# Patient Record
Sex: Male | Born: 1995 | Race: Black or African American | Hispanic: No | Marital: Single | Smoking: Never smoker
Health system: Southern US, Community
[De-identification: ages and names within clinical notes are randomized; demographics above are authoritative.]

## PROBLEM LIST (undated history)

## (undated) DIAGNOSIS — K219 Gastro-esophageal reflux disease without esophagitis: Secondary | ICD-10-CM

---

## 2019-11-11 ENCOUNTER — Ambulatory Visit
Admission: EM | Admit: 2019-11-11 | Discharge: 2019-11-11 | Disposition: A | Discharge status: Home / Self Care | Payer: Self-pay | Attending: Physician Assistant | Admitting: Physician Assistant

## 2019-11-11 DIAGNOSIS — R0789 Other chest pain: Secondary | ICD-10-CM

## 2019-11-11 DIAGNOSIS — Z20822 Contact with and (suspected) exposure to covid-19: Secondary | ICD-10-CM

## 2019-11-11 DIAGNOSIS — Z20828 Contact with and (suspected) exposure to other viral communicable diseases: Secondary | ICD-10-CM | POA: Diagnosis not present

## 2019-11-11 HISTORY — DX: Gastro-esophageal reflux disease without esophagitis: K21.9

## 2019-11-11 MED ORDER — METHOCARBAMOL 500 MG PO TABS: 500.0000 mg | ORAL_TABLET | Freq: Two times a day (BID) | ORAL | 0 refills | Status: AC

## 2019-11-11 NOTE — ED Provider Notes (Signed)
[facility]    CSN: [id number] Arrival date & time: 11/11/19  1005      History   Chief Complaint Chief Complaint  Patient presents with  . chest tightness    HPI Patrick Mitchell is a 23 y.o. male.   23 year old male comes in for 1 week history of left sided chest tightness. Denies URI symptoms. Denies fever, chills, body aches. Denies abdominal pain, nausea, vomiting, diarrhea. Denies shortness of breath, loss of taste/smell. Left sided chest tightness is constant, worse with laying down. Denies association with food. History of acid reflux that feels that same. Started nexium without relief.  Patient would like COVID testing as he has had secondary exposure to positive COVID.      Past Medical History:  Diagnosis Date  . Acid reflux     There are no active problems to display for this patient.   History reviewed. No pertinent surgical history.     Home Medications    Prior to Admission medications   Medication Sig Start Date End Date Taking? Authorizing Provider  methocarbamol (ROBAXIN) 500 MG tablet Take 1 tablet (500 mg total) by mouth 2 (two) times daily. 11/11/19   Ok Edwards, PA-C    Family History No family history on file.  Social History Social History   Tobacco Use  . Smoking status: Never Smoker  . Smokeless tobacco: Never Used  Substance Use Topics  . Alcohol use: Yes    Comment: social   . Drug use: Never     Allergies   Patient has no known allergies.   Review of Systems Review of Systems  Reason unable to perform ROS: See HPI as above.     Physical Exam Triage Vital Signs ED Triage Vitals  Enc Vitals Group     BP 11/11/19 1025 129/82     Pulse Rate 11/11/19 1025 80     Resp 11/11/19 1025 16     Temp 11/11/19 1025 98.2 F (36.8 C)     Temp Source 11/11/19 1025 Oral     SpO2 11/11/19 1025 97 %     Weight --      Height --      Head Circumference --      Peak Flow --      Pain Score 11/11/19 1026 2     Pain  Loc --      Pain Edu? --      Excl. in [place]? --    No data found.  Updated Vital Signs BP 129/82 (BP Location: Left Arm)   Pulse 80   Temp 98.2 F (36.8 C) (Oral)   Resp 16   SpO2 97%   Physical Exam Constitutional:      General: He is not in acute distress.    Appearance: Normal appearance. He is not ill-appearing, toxic-appearing or diaphoretic.  HENT:     Head: Normocephalic and atraumatic.     Mouth/Throat:     Mouth: Mucous membranes are moist.     Pharynx: Oropharynx is clear. Uvula midline.  Neck:     Musculoskeletal: Normal range of motion and neck supple.  Cardiovascular:     Rate and Rhythm: Normal rate and regular rhythm.     Heart sounds: Normal heart sounds. No murmur. No friction rub. No gallop.   Pulmonary:     Effort: Pulmonary effort is normal. No accessory muscle usage, prolonged expiration, respiratory distress or retractions.     Comments: Lungs clear to  auscultation without adventitious lung sounds. Chest:     Comments: No tenderness to palpation.  Abdominal:     General: Bowel sounds are normal.     Palpations: Abdomen is soft.     Tenderness: There is no abdominal tenderness. There is no right CVA tenderness, left CVA tenderness, guarding or rebound.  Musculoskeletal:     Comments: No tenderness to palpation of shoulder, neck, elbow. Full ROM. Strength normal and equal bilaterally. Sensation intact and equal bilaterally.   Neurological:     General: No focal deficit present.     Mental Status: He is alert and oriented to person, place, and time.    UC Treatments / Results  Labs (all labs ordered are listed, but only abnormal results are displayed) Labs Reviewed  NOVEL CORONAVIRUS, NAA    EKG   Radiology No results found.  Procedures Procedures (including critical care time)  Medications Ordered in UC Medications - No data to display  Initial Impression / Assessment and Plan / UC Course  I have reviewed the triage vital signs and the  nursing notes.  Pertinent labs & imaging results that were available during my care of the patient were reviewed by me and considered in my medical decision making (see chart for details).    COVID ordered. Patient to quarantine until testing results return. No alarming signs on exam.  Patient speaking in full sentences without respiratory distress. Discussed possible acid reflux/muscle spasms causing symptoms as well. Symptomatic treatment discussed.  Push fluids.  Return precautions given.  Patient expresses understanding and agrees to plan.  Final Clinical Impressions(s) / UC Diagnoses   Final diagnoses:  Exposure to COVID-19 virus  Chest tightness   ED Prescriptions    Medication Sig Dispense Auth. Provider   methocarbamol (ROBAXIN) 500 MG tablet Take 1 tablet (500 mg total) by mouth 2 (two) times daily. 20 tablet Belinda Fisher, PA-C     PDMP not reviewed this encounter.   Belinda Fisher, PA-C 11/11/19 1202

## 2019-11-11 NOTE — Discharge Instructions (Signed)
COVID testing ordered, please remain in quarantine until testing results return. Ibuprofen 800mg  three times a day for muscle pain. Robaxin as needed, this can make you drowsy, so do not take if you are going to drive, operate heavy machinery, or make important decisions. If experiencing shortness of breath, trouble breathing, go to the emergency department for further evaluation needed.

## 2019-11-11 NOTE — ED Triage Notes (Signed)
Pt c/o chest tightness to lt upper chest area for 1 wk. States hx of same and dx'd with acid reflux. States taken Nexium with no relief. Pt requesting COVID testing

## 2019-11-13 LAB — NOVEL CORONAVIRUS, NAA: SARS-CoV-2, NAA: NOT DETECTED

## 2020-05-17 ENCOUNTER — Emergency Department (HOSPITAL_BASED_OUTPATIENT_CLINIC_OR_DEPARTMENT_OTHER)
Admission: EM | Admit: 2020-05-17 | Discharge: 2020-05-17 | Disposition: A | Discharge status: Home / Self Care | Payer: BC Managed Care – PPO | Attending: Emergency Medicine | Admitting: Emergency Medicine

## 2020-05-17 ENCOUNTER — Encounter (HOSPITAL_BASED_OUTPATIENT_CLINIC_OR_DEPARTMENT_OTHER): Payer: Self-pay

## 2020-05-17 ENCOUNTER — Emergency Department (HOSPITAL_BASED_OUTPATIENT_CLINIC_OR_DEPARTMENT_OTHER): Payer: BC Managed Care – PPO

## 2020-05-17 ENCOUNTER — Other Ambulatory Visit: Payer: Self-pay

## 2020-05-17 DIAGNOSIS — R079 Chest pain, unspecified: Secondary | ICD-10-CM

## 2020-05-17 DIAGNOSIS — R0789 Other chest pain: Secondary | ICD-10-CM | POA: Insufficient documentation

## 2020-05-17 LAB — TROPONIN I (HIGH SENSITIVITY): Troponin I (High Sensitivity): 2 ng/L (ref <18)

## 2020-05-17 NOTE — ED Provider Notes (Signed)
[facility] Provider Note   CSN: [id number] Arrival date & time: 05/17/20  4332     History Chief Complaint  Patient presents with  . Chest Pain    Patrick Mitchell is a 24 y.o. male.  HPI   24 year old male with chest pain.  Describes sensation of chest tightness.  He has had intermittent symptoms for months.  More noticeable in the past week.  Today he was getting ready for work.  He was running late and rushing.  As he was getting ready he felt this tightness again and he felt like his heart was racing.  He is unsure if it was related to anxiety or not.  As he was driving to work symptoms persisted so he went to his parents house who live nearby.  They thought it would be best if he came to the emergency room for evaluation.  Currently feeling better but still having this tightness.  Denies any other associated symptoms.  History of reflux but states that current symptoms feel different.  Is not currently on medication for the reflux.  Past Medical History:  Diagnosis Date  . Acid reflux     There are no problems to display for this patient.   History reviewed. No pertinent surgical history.     No family history on file.  Social History   Tobacco Use  . Smoking status: Never Smoker  . Smokeless tobacco: Never Used  Substance Use Topics  . Alcohol use: Yes    Comment: social   . Drug use: Never    Home Medications Prior to Admission medications   Medication Sig Start Date End Date Taking? Authorizing Provider  methocarbamol (ROBAXIN) 500 MG tablet Take 1 tablet (500 mg total) by mouth 2 (two) times daily. 11/11/19   Ok Edwards, PA-C    Allergies    Patient has no known allergies.  Review of Systems   Review of Systems All systems reviewed and negative, other than as noted in HPI.  Physical Exam Updated Vital Signs BP 121/84   Pulse 89   Temp 98.5 F (36.9 C) (Oral)   Resp 19   Ht 5\' 11"  (1.803 m)   Wt 85.1 kg   SpO2  100%   BMI 26.18 kg/m   Physical Exam Vitals and nursing note reviewed.  Constitutional:      General: He is not in acute distress.    Appearance: He is well-developed.  HENT:     Head: Normocephalic and atraumatic.  Eyes:     General:        Right eye: No discharge.        Left eye: No discharge.     Conjunctiva/sclera: Conjunctivae normal.  Cardiovascular:     Rate and Rhythm: Normal rate and regular rhythm.     Heart sounds: Normal heart sounds. No murmur heard.  No friction rub. No gallop.   Pulmonary:     Effort: Pulmonary effort is normal. No respiratory distress.     Breath sounds: Normal breath sounds.  Abdominal:     General: There is no distension.     Palpations: Abdomen is soft.     Tenderness: There is no abdominal tenderness.  Musculoskeletal:        General: No tenderness.     Cervical back: Neck supple.  Skin:    General: Skin is warm and dry.  Neurological:     Mental Status: He is alert.  Psychiatric:  Behavior: Behavior normal.        Thought Content: Thought content normal.     ED Results / Procedures / Treatments   Labs (all labs ordered are listed, but only abnormal results are displayed) Labs Reviewed  TROPONIN I (HIGH SENSITIVITY)    EKG EKG Interpretation  Date/Time:  Monday May 17 2020 09:19:51 EDT Ventricular Rate:  92 PR Interval:    QRS Duration: 84 QT Interval:  348 QTC Calculation: 431 R Axis:   77 Text Interpretation: Sinus rhythm Confirmed by Raeford Razor [phone]) on 05/17/2020 9:34:31 AM   Radiology DG Chest 2 View  Result Date: 05/17/2020 CLINICAL DATA:  Chest pain for 1 week.  Tachycardia. EXAM: CHEST - 2 VIEW COMPARISON:  04/27/2015 FINDINGS: The heart size and mediastinal contours are within normal limits. Both lungs are clear. No evidence of pneumothorax or pleural effusion. The visualized skeletal structures are unremarkable. IMPRESSION: Normal study. Electronically Signed   By: Danae Orleans M.D.   On:  05/17/2020 09:58    Procedures Procedures (including critical care time)  Medications Ordered in ED Medications - No data to display  ED Course  I have reviewed the triage vital signs and the nursing notes.  Pertinent labs & imaging results that were available during my care of the patient were reviewed by me and considered in my medical decision making (see chart for details).    MDM Rules/Calculators/A&P                          24 year old male with chest pain.  Atypical for ACS.  Doubt PE, dissection or other emergent process.  EKG with no overt ischemic changes.  Troponin normal.  Chest x-ray without any acute abnormality.  He is afebrile.  Lungs sound clear.  May benefit from being on a PPI or H2 blocker.  Not completely convinced that symptoms are from reflux though.  Regardless, I doubt emergent process.  Outpatient follow-up.  Final Clinical Impression(s) / ED Diagnoses Final diagnoses:  Chest pain, unspecified type    Rx / DC Orders ED Discharge Orders    None       Raeford Razor, MD 05/17/20 1043

## 2020-05-17 NOTE — ED Triage Notes (Signed)
t states that he has been having chest tightness for 1 week reports some shaking in his arms and hands and feeling like his heart was beating fast. Pt also reports headaches, and history of acid reflux.

## 2020-05-17 NOTE — ED Notes (Signed)
Patient transported to X-ray 

## 2021-05-29 IMAGING — CR DG CHEST 2V
2 series · 2 of 2 positions shown · non-contrast
Comparison: 04/27/2015

CLINICAL DATA: Chest pain for 1 week.  Tachycardia.

EXAM:
CHEST - 2 VIEW

[w chest pa]
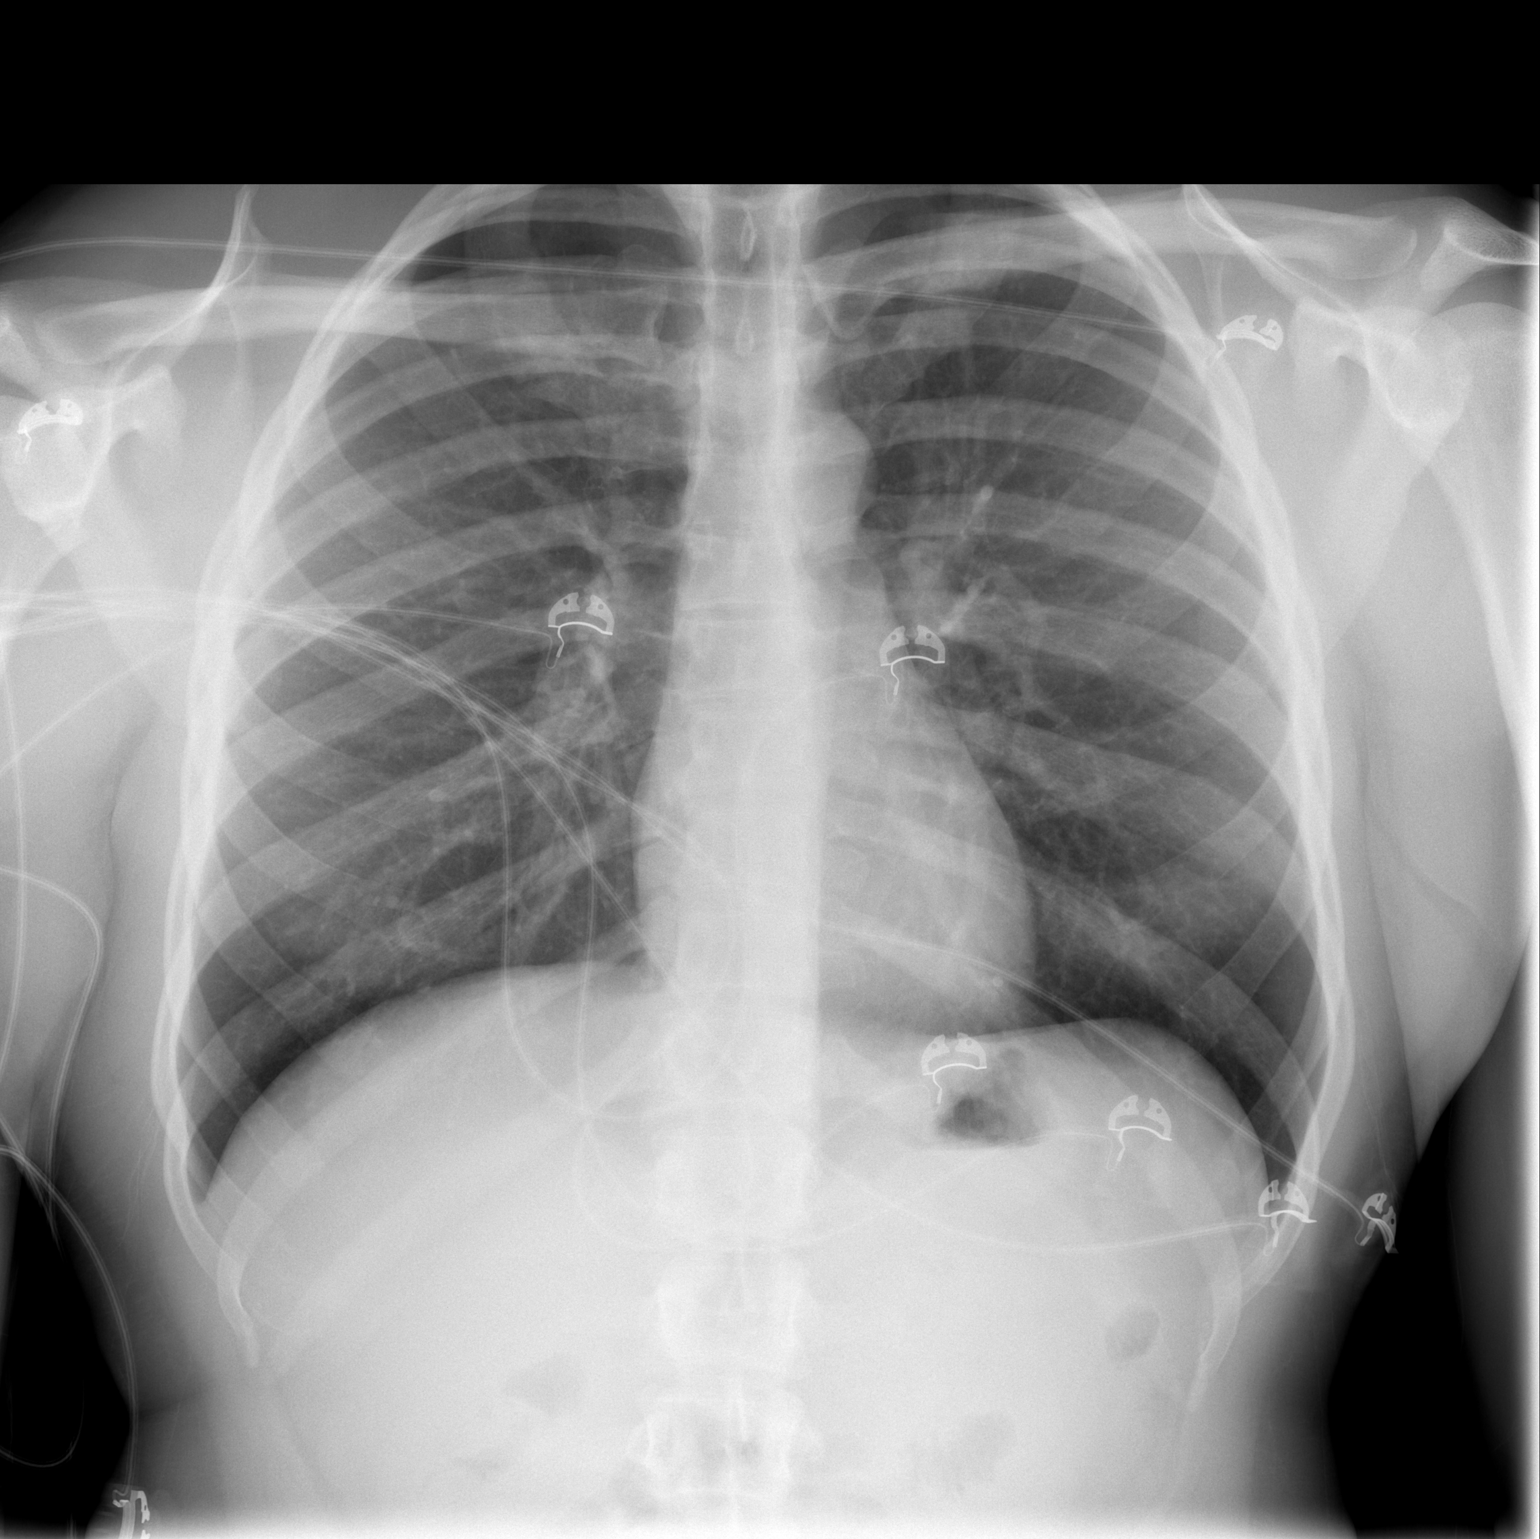

[w chest lat]
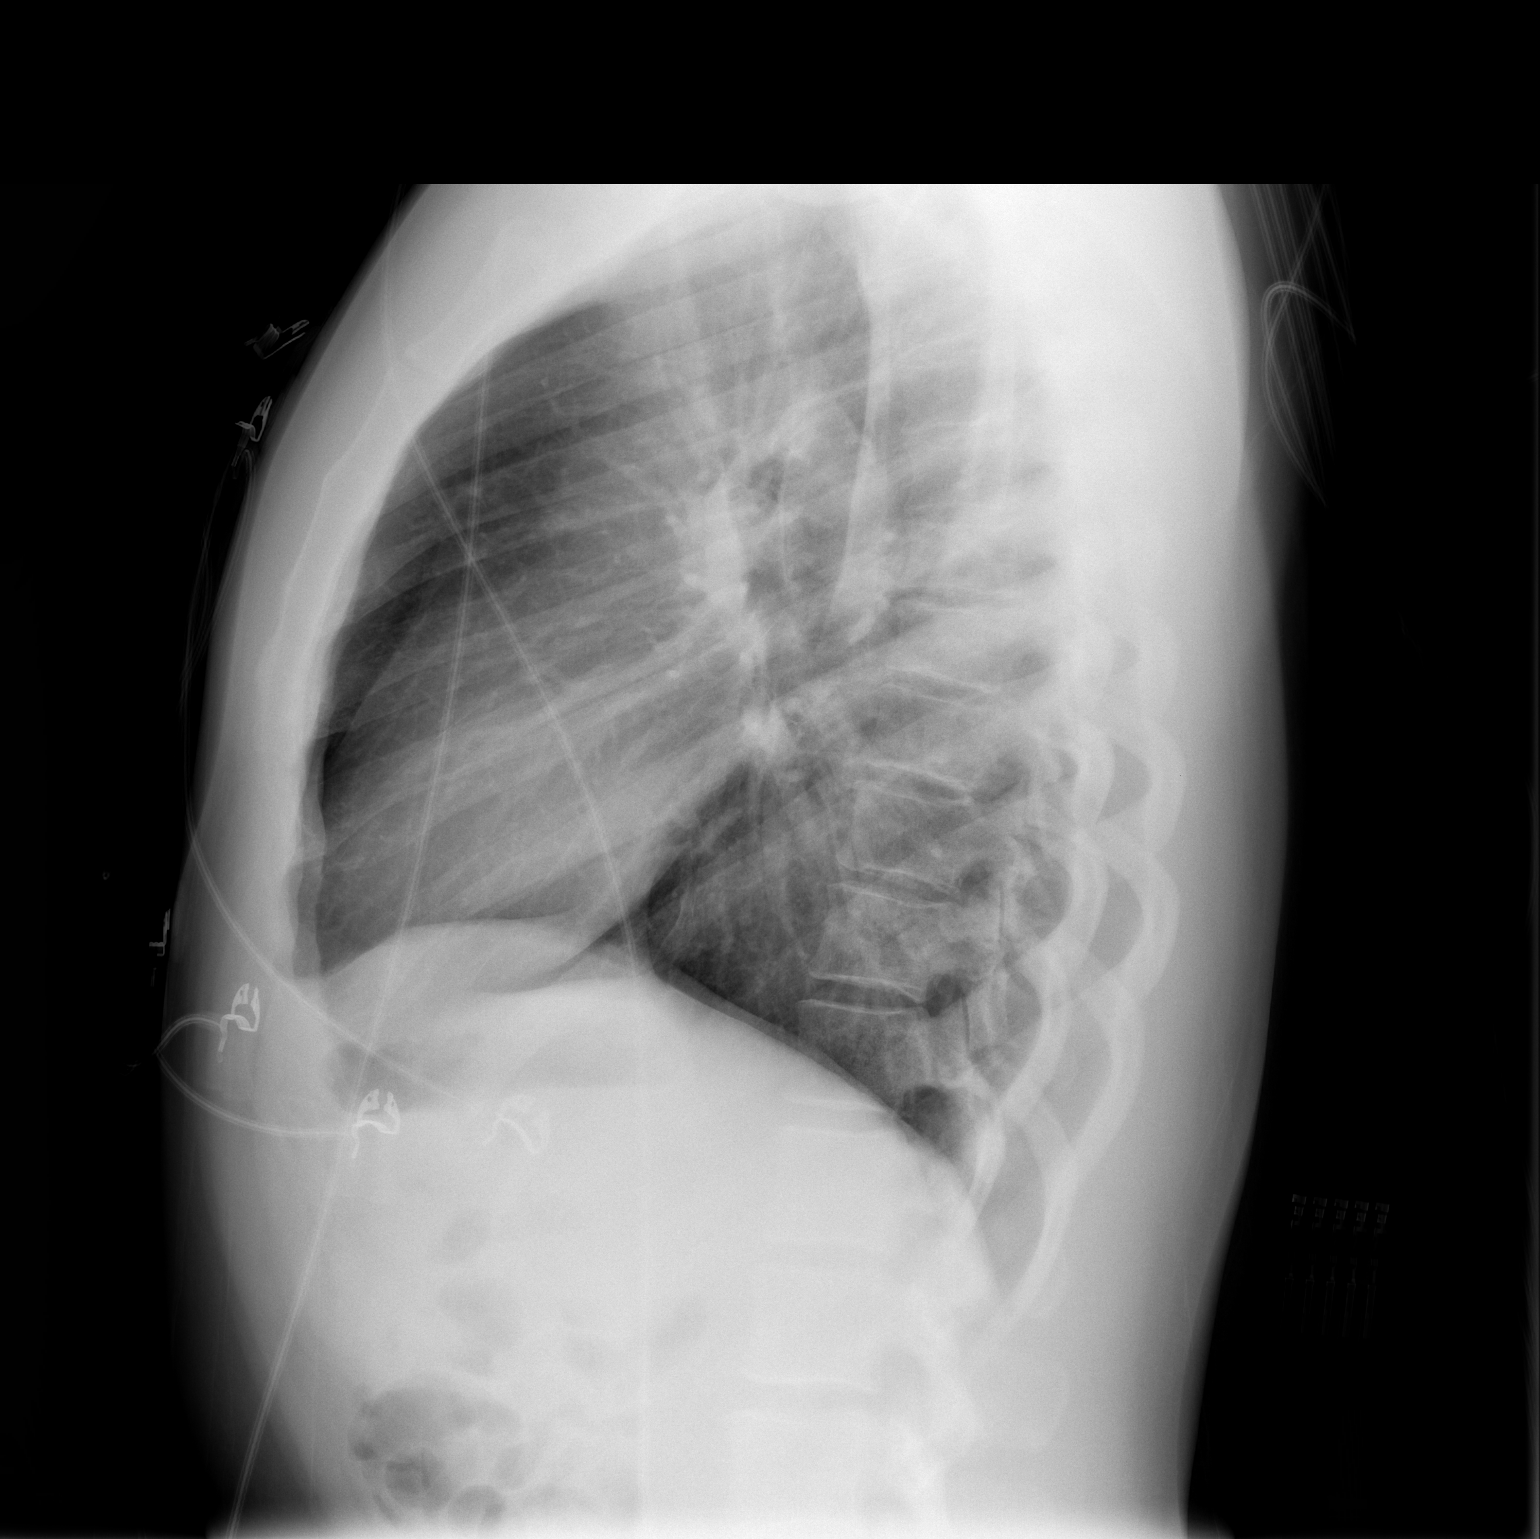

[2 of 2 positions shown; findings below may reference images not displayed]

FINDINGS: The heart size and mediastinal contours are within normal limits.
Both lungs are clear. No evidence of pneumothorax or pleural
effusion. The visualized skeletal structures are unremarkable.
IMPRESSION: Normal study.

## 2022-08-18 ENCOUNTER — Emergency Department (HOSPITAL_BASED_OUTPATIENT_CLINIC_OR_DEPARTMENT_OTHER): Payer: BC Managed Care – PPO

## 2022-08-18 ENCOUNTER — Encounter (HOSPITAL_BASED_OUTPATIENT_CLINIC_OR_DEPARTMENT_OTHER): Payer: Self-pay | Admitting: *Deleted

## 2022-08-18 ENCOUNTER — Emergency Department (HOSPITAL_BASED_OUTPATIENT_CLINIC_OR_DEPARTMENT_OTHER)
Admission: EM | Admit: 2022-08-18 | Discharge: 2022-08-18 | Disposition: A | Discharge status: Home / Self Care | Payer: BC Managed Care – PPO | Attending: Emergency Medicine | Admitting: Emergency Medicine

## 2022-08-18 ENCOUNTER — Other Ambulatory Visit: Payer: Self-pay

## 2022-08-18 DIAGNOSIS — N433 Hydrocele, unspecified: Secondary | ICD-10-CM | POA: Diagnosis not present

## 2022-08-18 DIAGNOSIS — N50812 Left testicular pain: Secondary | ICD-10-CM | POA: Diagnosis present

## 2022-08-18 LAB — URINALYSIS, ROUTINE W REFLEX MICROSCOPIC
Bilirubin Urine: NEGATIVE
Glucose, UA: NEGATIVE mg/dL
Hgb urine dipstick: NEGATIVE
Ketones, ur: NEGATIVE mg/dL
Leukocytes,Ua: NEGATIVE
Nitrite: NEGATIVE
Protein, ur: NEGATIVE mg/dL
Specific Gravity, Urine: 1.02 (ref 1.005–1.030)
pH: 7 (ref 5.0–8.0)

## 2022-08-18 LAB — CBC
HCT: 43.8 % (ref 39.0–52.0)
Hemoglobin: 15.5 g/dL (ref 13.0–17.0)
MCH: 30.7 pg (ref 26.0–34.0)
MCHC: 35.4 g/dL (ref 30.0–36.0)
MCV: 86.7 fL (ref 80.0–100.0)
Platelets: 243 10*3/uL (ref 150–400)
RBC: 5.05 MIL/uL (ref 4.22–5.81)
RDW: 12 % (ref 11.5–15.5)
WBC: 5.4 10*3/uL (ref 4.0–10.5)
nRBC: 0 % (ref 0.0–0.2)

## 2022-08-18 LAB — COMPREHENSIVE METABOLIC PANEL
ALT: 16 U/L (ref 0–44)
AST: 23 U/L (ref 15–41)
Albumin: 4.6 g/dL (ref 3.5–5.0)
Alkaline Phosphatase: 63 U/L (ref 38–126)
Anion gap: 10 (ref 5–15)
BUN: 19 mg/dL (ref 6–20)
CO2: 28 mmol/L (ref 22–32)
Calcium: 9.5 mg/dL (ref 8.9–10.3)
Chloride: 101 mmol/L (ref 98–111)
Creatinine, Ser: 1.39 mg/dL — ABNORMAL HIGH (ref 0.61–1.24)
GFR, Estimated: >60 mL/min (ref >60)
Glucose, Bld: 99 mg/dL (ref 70–99)
Potassium: 3.7 mmol/L (ref 3.5–5.1)
Sodium: 139 mmol/L (ref 135–145)
Total Bilirubin: 0.9 mg/dL (ref 0.3–1.2)
Total Protein: 8.1 g/dL (ref 6.5–8.1)

## 2022-08-18 LAB — RAPID HIV SCREEN (HIV 1/2 AB+AG)
HIV 1/2 Antibodies: NONREACTIVE
HIV-1 P24 Antigen - HIV24: NONREACTIVE

## 2022-08-18 LAB — LIPASE, BLOOD: Lipase: 66 U/L — ABNORMAL HIGH (ref 11–51)

## 2022-08-18 NOTE — ED Provider Notes (Signed)
[facility] EMERGENCY DEPARTMENT Provider Note   CSN: [id number] Arrival date & time: 08/18/22  1619     History  Chief Complaint  Patient presents with   Abdominal Pain   Testicle Pain    Patrick Mitchell is a 26 y.o. male.  Patient is a 26 year old male with no significant past medical history presenting to the emergency department with left testicular pain.  Patient states that he has had discomfort in his left testicle for the last week.  He states that now it is radiating to his left lower abdomen so he decided to come to the ER for evaluation.  He denies any dysuria but does report urinary frequency.  He denies any penile discharge.  He denies any testicular swelling.  He denies any rashes or lesions.  He denies fevers or chills, nausea or vomiting, diarrhea or constipation.  He states that he is sexually active but has not had any recent unprotected sex or new partners.  The history is provided by the patient.  Abdominal Pain Testicle Pain Associated symptoms include abdominal pain.       Home Medications Prior to Admission medications   Medication Sig Start Date End Date Taking? Authorizing Provider  methocarbamol (ROBAXIN) 500 MG tablet Take 1 tablet (500 mg total) by mouth 2 (two) times daily. 11/11/19   Belinda Fisher, PA-C      Allergies    Patient has no known allergies.    Review of Systems   Review of Systems  Gastrointestinal:  Positive for abdominal pain.  Genitourinary:  Positive for testicular pain.    Physical Exam Updated Vital Signs BP 100/83   Pulse 93   Temp 98.6 F (37 C) (Oral)   Resp 18   SpO2 98%  Physical Exam Vitals and nursing note reviewed.  Constitutional:      General: He is not in acute distress.    Appearance: He is well-developed.  HENT:     Head: Normocephalic and atraumatic.     Mouth/Throat:     Mouth: Mucous membranes are moist.     Pharynx: Oropharynx is clear.  Eyes:     Extraocular Movements: Extraocular  movements intact.  Cardiovascular:     Rate and Rhythm: Normal rate and regular rhythm.  Pulmonary:     Effort: Pulmonary effort is normal.     Breath sounds: Normal breath sounds.  Abdominal:     General: Abdomen is flat.     Palpations: Abdomen is soft.     Tenderness: There is no abdominal tenderness.     Hernia: No hernia is present.  Genitourinary:    Penis: Normal.      Testes: Normal.        Right: Mass, tenderness or swelling not present.        Left: Mass, tenderness or swelling not present.  Skin:    General: Skin is warm and dry.     Findings: No rash.  Neurological:     General: No focal deficit present.     Mental Status: He is alert and oriented to person, place, and time.  Psychiatric:        Mood and Affect: Mood normal.        Behavior: Behavior normal.     ED Results / Procedures / Treatments   Labs (all labs ordered are listed, but only abnormal results are displayed) Labs Reviewed  LIPASE, BLOOD - Abnormal; Notable for the following components:      Result  Value   Lipase 66 (*)    All other components within normal limits  COMPREHENSIVE METABOLIC PANEL - Abnormal; Notable for the following components:   Creatinine, Ser 1.39 (*)    All other components within normal limits  CBC  URINALYSIS, ROUTINE W REFLEX MICROSCOPIC  RPR  RAPID HIV SCREEN (HIV 1/2 AB+AG)  GC/CHLAMYDIA PROBE AMP ([place]) NOT AT [facility]    EKG None  Radiology US SCROTUM W/DOPPLER  Result Date: 08/18/2022 CLINICAL DATA:  Left testicular discomfort for 2 weeks. EXAM: SCROTAL ULTRASOUND DOPPLER ULTRASOUND OF THE TESTICLES TECHNIQUE: Complete ultrasound examination of the testicles, epididymis, and other scrotal structures was performed. Color and spectral Doppler ultrasound were also utilized to evaluate blood flow to the testicles. COMPARISON:  None Available. FINDINGS: Right testicle Measurements: 4.3 x 2.2 x 3.0 cm. No mass or microlithiasis visualized. Left testicle  Measurements: 2.8 x 4.7 x 2.0 cm. No mass or microlithiasis visualized. Right epididymis:  Normal in size and appearance. Left epididymis:  Normal in size and appearance. Hydrocele:  Minimal hydroceles are noted bilaterally. Varicocele:  None visualized. Pulsed Doppler interrogation of both testes demonstrates normal low resistance arterial and venous waveforms bilaterally. IMPRESSION: Minimal bilateral hydroceles. Electronically Signed   By: Zerita Boers M.D.   On: 08/18/2022 18:51    Procedures Procedures    Medications Ordered in ED Medications - No data to display  ED Course/ Medical Decision Making/ A&P Clinical Course as of 08/18/22 1931  Fri Aug 18, 2022  1929 Since ultrasound shows evidence of hydrocele.  He was recommended to scrotal support underwear and urology follow-up.  He was offered STD prophylaxis but states he would prefer for his results and to be treated if anything comes back positive.  He was given strict return precautions. [VK]    Clinical Course User Index [VK] Ottie Glazier, DO                           Medical Decision Making This patient presents to the ED with chief complaint(s) of testicular pain with no pertinent past medical history which further complicates the presenting complaint. The complaint involves an extensive differential diagnosis and also carries with it a high risk of complications and morbidity.    The differential diagnosis includes due to chronicity of pain, nephrolithiasis is less likely, possible epididymitis, orchitis, testicular torsion, UTI, STD, no rashes or lesions  Additional history obtained: Additional history obtained from N/A  ED Course and Reassessment: Patient was initially evaluated by triage and had labs and urine performed.  Urine is negative for UTI and shows no blood making nephrolithiasis less likely.  He is also very well-appearing with no tenderness on exam.  His labs are within normal range.  He was offered STD  testing and is amenable for testing for gonorrhea, chlamydia, HIV and syphilis.  He will have an ultrasound performed to evaluate for torsion, epididymitis or orchitis.  Independent labs interpretation:  The following labs were independently interpreted: Within normal range  Independent visualization of imaging: - I independently visualized the following imaging with scope of interpretation limited to determining acute life threatening conditions related to emergency care: Testicular ultrasound, which revealed hydrocele  Consultation: - Consulted or discussed management/test interpretation w/ external professional: N/A  Consideration for admission or further workup: Patient will be given outpatient follow-up with urology but has no emergent conditions that require admission at this time. Social Determinants of health: N/A  Amount and/or Complexity of Data Reviewed Labs: ordered.          Final Clinical Impression(s) / ED Diagnoses Final diagnoses:  Hydrocele in adult    Rx / DC Orders ED Discharge Orders          Ordered    Ambulatory referral to Urology        08/18/22 1930              Phoebe Sharps, DO 08/18/22 1931

## 2022-08-18 NOTE — Discharge Instructions (Signed)
You were seen in the emergency department for your testicular pain.  Your ultrasound shows that you have a hydrocele which is fluid in your scrotum around your testicles.  There is no signs of any infection of this fluid.  You can wear scrotal support underwear to help with the swelling and take Tylenol or Motrin as needed for pain.  We did test you for STDs today and if any of these come back positive, you will receive a call.  You can follow-up with urology for further management of the hydrocele.  You should return to the emergency department if you are having significantly worsening testicular pain, you have fevers, or if you have any other new or concerning symptoms.

## 2022-08-18 NOTE — ED Triage Notes (Signed)
Pt has had a discomfort in his left testicle x 2 weeks, he states that it is not a pain but that it is "mostly annoying", not associated with any trauma, no GU symtoms, no recent unprotected sex.  Pt states that this week he began having some mild lower abdominal discomfort which is not pain but after online search he decided to come be evaluated. No distress, no fever or chills, no n/v or diarrhea

## 2022-08-19 LAB — RPR: RPR Ser Ql: NONREACTIVE

## 2022-08-21 LAB — GC/CHLAMYDIA PROBE AMP (~~LOC~~) NOT AT ARMC
Chlamydia: NEGATIVE
Comment: NEGATIVE
Comment: NORMAL
Neisseria Gonorrhea: NEGATIVE

## 2022-09-19 ENCOUNTER — Encounter: Payer: Self-pay | Admitting: Emergency Medicine

## 2024-05-30 ENCOUNTER — Encounter (HOSPITAL_BASED_OUTPATIENT_CLINIC_OR_DEPARTMENT_OTHER): Payer: Self-pay

## 2024-05-30 ENCOUNTER — Emergency Department (HOSPITAL_BASED_OUTPATIENT_CLINIC_OR_DEPARTMENT_OTHER)
Admission: EM | Admit: 2024-05-30 | Discharge: 2024-05-30 | Disposition: A | Discharge status: Home / Self Care | Attending: Emergency Medicine | Admitting: Emergency Medicine

## 2024-05-30 ENCOUNTER — Other Ambulatory Visit: Payer: Self-pay

## 2024-05-30 ENCOUNTER — Emergency Department (HOSPITAL_BASED_OUTPATIENT_CLINIC_OR_DEPARTMENT_OTHER)

## 2024-05-30 DIAGNOSIS — R079 Chest pain, unspecified: Secondary | ICD-10-CM | POA: Diagnosis present

## 2024-05-30 LAB — CBC WITH DIFFERENTIAL/PLATELET
Abs Immature Granulocytes: 0 10*3/uL (ref 0.00–0.07)
Basophils Absolute: 0 10*3/uL (ref 0.0–0.1)
Basophils Relative: 0 %
Eosinophils Absolute: 0 10*3/uL (ref 0.0–0.5)
Eosinophils Relative: 0 %
HCT: 46.1 % (ref 39.0–52.0)
Hemoglobin: 16 g/dL (ref 13.0–17.0)
Immature Granulocytes: 0 %
Lymphocytes Relative: 48 %
Lymphs Abs: 2.1 10*3/uL (ref 0.7–4.0)
MCH: 30 pg (ref 26.0–34.0)
MCHC: 34.7 g/dL (ref 30.0–36.0)
MCV: 86.3 fL (ref 80.0–100.0)
Monocytes Absolute: 0.3 10*3/uL (ref 0.1–1.0)
Monocytes Relative: 6 %
Neutro Abs: 2.1 10*3/uL (ref 1.7–7.7)
Neutrophils Relative %: 46 %
Platelets: 235 10*3/uL (ref 150–400)
RBC: 5.34 MIL/uL (ref 4.22–5.81)
RDW: 12 % (ref 11.5–15.5)
WBC: 4.5 10*3/uL (ref 4.0–10.5)
nRBC: 0 % (ref 0.0–0.2)

## 2024-05-30 LAB — COMPREHENSIVE METABOLIC PANEL WITH GFR
ALT: 15 U/L (ref 0–44)
AST: 29 U/L (ref 15–41)
Albumin: 5 g/dL (ref 3.5–5.0)
Alkaline Phosphatase: 76 U/L (ref 38–126)
Anion gap: 10 (ref 5–15)
BUN: 13 mg/dL (ref 6–20)
CO2: 27 mmol/L (ref 22–32)
Calcium: 9.8 mg/dL (ref 8.9–10.3)
Chloride: 103 mmol/L (ref 98–111)
Creatinine, Ser: 1.27 mg/dL — ABNORMAL HIGH (ref 0.61–1.24)
GFR, Estimated: >60 mL/min (ref >60)
Glucose, Bld: 100 mg/dL — ABNORMAL HIGH (ref 70–99)
Potassium: 3.8 mmol/L (ref 3.5–5.1)
Sodium: 141 mmol/L (ref 135–145)
Total Bilirubin: 0.9 mg/dL (ref 0.0–1.2)
Total Protein: 8.1 g/dL (ref 6.5–8.1)

## 2024-05-30 LAB — TROPONIN T, HIGH SENSITIVITY: Troponin T High Sensitivity: <15 ng/L (ref <19)

## 2024-05-30 LAB — LIPASE, BLOOD: Lipase: 69 U/L — ABNORMAL HIGH (ref 11–51)

## 2024-05-30 MED ORDER — OMEPRAZOLE 20 MG PO CPDR: 20.0000 mg | DELAYED_RELEASE_CAPSULE | Freq: Every day | ORAL | 0 refills | Status: AC

## 2024-05-30 NOTE — ED Provider Notes (Addendum)
 [place] [facility] Provider Note   CSN: [id number] Arrival date & time: 05/30/24  1705     Patient presents with: Chest Pain   Patrick Mitchell is a 28 y.o. male.   HPI Patient reports he has been experiencing pain in the left side of his chest for about 10 days.  He illustrates an area that is right at the outer aspect of the pectoralis up slightly onto the anterior shoulder.  He reports that he has achy and full feeling in this region.  He denies any shortness of breath.  Denies fever or cough.  Pain is not significantly reproducible with arm movements.  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  He denies any injuries or repetitive activities that he feels he is not accustomed to.  No lower extremity swelling or calf pain.    Prior to Admission medications   Medication Sig Start Date End Date Taking? Authorizing Provider  omeprazole (PRILOSEC) 20 MG capsule Take 1 capsule (20 mg total) by mouth daily. 05/30/24  Yes Armenta Canning, MD  methocarbamol  (ROBAXIN ) 500 MG tablet Take 1 tablet (500 mg total) by mouth 2 (two) times daily. 11/11/19   Babara Greig GAILS, PA-C    Allergies: Patient has no known allergies.    Review of Systems  Updated Vital Signs BP 109/73   Pulse 62   Temp 98.8 F (37.1 C) (Oral)   Resp 20   Wt 88.5 kg   SpO2 98%   BMI 27.20 kg/m   Physical Exam Constitutional:      Comments: Patient is alert nontoxic.  Clinically well in appearance.  Well-nourished well-developed.  HENT:     Mouth/Throat:     Pharynx: Oropharynx is clear.   Cardiovascular:     Rate and Rhythm: Normal rate and regular rhythm.     Heart sounds: Normal heart sounds.  Pulmonary:     Effort: Pulmonary effort is normal.     Breath sounds: Normal breath sounds.  Chest:     Chest wall: No tenderness.  Abdominal:     General: There is no distension.     Palpations: Abdomen is soft.      Tenderness: There is no abdominal tenderness. There is no guarding.   Musculoskeletal:        General: No swelling or tenderness. Normal range of motion.     Cervical back: Normal range of motion and neck supple.     Right lower leg: No edema.     Left lower leg: No edema.   Skin:    General: Skin is warm and dry.   Neurological:     General: No focal deficit present.     Mental Status: He is oriented to person, place, and time.     Motor: No weakness.     Coordination: Coordination normal.   Psychiatric:        Mood and Affect: Mood normal.     (all labs ordered are listed, but only abnormal results are displayed) Labs Reviewed  COMPREHENSIVE METABOLIC PANEL WITH GFR - Abnormal; Notable for the following components:      Result Value   Glucose, Bld 100 (*)    Creatinine, Ser 1.27 (*)    All other components within normal limits  LIPASE, BLOOD - Abnormal; Notable for the following components:   Lipase 69 (*)    All other components within normal limits  CBC  WITH DIFFERENTIAL/PLATELET  TROPONIN T, HIGH SENSITIVITY  TROPONIN T, HIGH SENSITIVITY    EKG: EKG Interpretation Date/Time:  Friday May 30 2024 17:15:32 EDT Ventricular Rate:  75 PR Interval:  158 QRS Duration:  90 QT Interval:  369 QTC Calculation: 413 R Axis:   77  Text Interpretation: Sinus rhythm Consider right atrial enlargement Isolated t wave inversion Lead III compared to previous tracing , otherwise normal. Confirmed by Armenta Canning [phone]) on 05/30/2024 5:48:22 PM  Radiology: [facility] Chest 2 View Result Date: 05/30/2024 CLINICAL DATA:  cp EXAM: CHEST - 2 VIEW COMPARISON:  May 17, 2020 FINDINGS: No focal airspace consolidation, pleural effusion, or pneumothorax. No cardiomegaly.No acute fracture or destructive lesion. IMPRESSION: No acute cardiopulmonary abnormality. Electronically Signed   By: Rogelia Myers M.D.   On: 05/30/2024 18:59     Procedures   Medications Ordered in the ED - No data to  display                                  Medical Decision Making Amount and/or Complexity of Data Reviewed Labs: ordered. Radiology: ordered.  Risk Prescription drug management.   Patient presents with chest pain left upper chest predominantly over the anterior shoulder and pectoralis area.  No associated ACS type symptoms.  At this time we will proceed with diagnostic evaluation with differential including ACS\pneumonia\musculoskeletal pain  Troponin less than 15 lipase 69 metabolic panel normal with normal GFR CBC normal with normal differential  EKG interpreted by myself no acute ischemic appearance.  At this time with diagnostic workup reassuring and patient clinically well in appearance with chest pain that appears to be low risk for ACS will plan to treat for reflux and musculoskeletal pain.  Patient reports he does feel like when he eats he gets the sensation of food not passing as quickly or some fullness. At this time this does not appear to have significance in light of the patient's current symptoms.  I have recommended he follow-up in outpatient basis and continue to monitor this. We discussed the very mild lipase elevation and the fact that this is unchanged from several years ago.  We also discussed use of Extra strength Tylenol over NSAIDs in the light of an empiric trial of omeprazole.  We discussed establishing follow-up and return precautions.  Patient and partner at bedside voiced understanding.       Final diagnoses:  Chest pain, unspecified type    ED Discharge Orders          Ordered    omeprazole (PRILOSEC) 20 MG capsule  Daily        05/30/24 2051               Armenta Canning, MD 05/30/24 2101    Armenta Canning, MD 05/30/24 2102

## 2024-05-30 NOTE — Discharge Instructions (Addendum)
 1.  Take omeprazole daily in the morning about 30 minutes before you eat for the next 2 weeks.  Review the dietary instructions and additional information in your discharge instructions regarding gastroesophageal reflux disease.  See if this treatment helps with your pain. 2.  At this time, your chest pain looks less likely to be due to heart or lung problems.  However if you develop a fever, cough, shortness of breath, worsening pain or lightheadedness return to emergency department immediately for recheck. 3.  Try to find a family doctor to follow-up with and for further evaluation if needed.  Use the referral number your discharge instructions for help if needed.

## 2024-05-30 NOTE — ED Triage Notes (Addendum)
 Pt reports left chest pain x 10days. Pain more toward left shoulder. Denies SOB or injury  Described as a constant dull ache. Pt has hx of GERD
